# Patient Record
Sex: Male | Born: 1950 | Smoking: Former smoker
Health system: Southern US, Community
[De-identification: ages and names within clinical notes are randomized; demographics above are authoritative.]

## PROBLEM LIST (undated history)

## (undated) HISTORY — PX: KNEE ARTHROSCOPY W/ MENISCAL REPAIR: SHX1877

---

## 1993-08-17 HISTORY — PX: BILATERAL CARPAL TUNNEL RELEASE: SHX6508

## 2018-07-26 DIAGNOSIS — H52209 Unspecified astigmatism, unspecified eye: Secondary | ICD-10-CM | POA: Diagnosis not present

## 2018-07-26 DIAGNOSIS — H524 Presbyopia: Secondary | ICD-10-CM | POA: Diagnosis not present

## 2018-07-26 DIAGNOSIS — H269 Unspecified cataract: Secondary | ICD-10-CM | POA: Diagnosis not present

## 2018-07-26 DIAGNOSIS — H5203 Hypermetropia, bilateral: Secondary | ICD-10-CM | POA: Diagnosis not present

## 2018-09-07 DIAGNOSIS — M25561 Pain in right knee: Secondary | ICD-10-CM | POA: Diagnosis not present

## 2018-09-19 DIAGNOSIS — M25561 Pain in right knee: Secondary | ICD-10-CM | POA: Diagnosis not present

## 2018-09-28 DIAGNOSIS — M25561 Pain in right knee: Secondary | ICD-10-CM | POA: Diagnosis not present

## 2018-10-27 DIAGNOSIS — S83231A Complex tear of medial meniscus, current injury, right knee, initial encounter: Secondary | ICD-10-CM | POA: Diagnosis not present

## 2018-10-27 DIAGNOSIS — M94261 Chondromalacia, right knee: Secondary | ICD-10-CM | POA: Diagnosis not present

## 2018-10-27 DIAGNOSIS — M2241 Chondromalacia patellae, right knee: Secondary | ICD-10-CM | POA: Diagnosis not present

## 2018-10-27 DIAGNOSIS — G8918 Other acute postprocedural pain: Secondary | ICD-10-CM | POA: Diagnosis not present

## 2018-11-04 DIAGNOSIS — M25561 Pain in right knee: Secondary | ICD-10-CM | POA: Diagnosis not present

## 2018-11-04 DIAGNOSIS — R262 Difficulty in walking, not elsewhere classified: Secondary | ICD-10-CM | POA: Diagnosis not present

## 2018-11-04 DIAGNOSIS — M25661 Stiffness of right knee, not elsewhere classified: Secondary | ICD-10-CM | POA: Diagnosis not present

## 2018-11-04 DIAGNOSIS — Z9889 Other specified postprocedural states: Secondary | ICD-10-CM | POA: Diagnosis not present

## 2018-11-09 DIAGNOSIS — M25661 Stiffness of right knee, not elsewhere classified: Secondary | ICD-10-CM | POA: Diagnosis not present

## 2018-11-09 DIAGNOSIS — R262 Difficulty in walking, not elsewhere classified: Secondary | ICD-10-CM | POA: Diagnosis not present

## 2018-11-18 DIAGNOSIS — Z9889 Other specified postprocedural states: Secondary | ICD-10-CM | POA: Diagnosis not present

## 2018-11-30 DIAGNOSIS — Z9889 Other specified postprocedural states: Secondary | ICD-10-CM | POA: Diagnosis not present

## 2018-11-30 DIAGNOSIS — M25661 Stiffness of right knee, not elsewhere classified: Secondary | ICD-10-CM | POA: Diagnosis not present

## 2018-12-20 DIAGNOSIS — E039 Hypothyroidism, unspecified: Secondary | ICD-10-CM | POA: Diagnosis not present

## 2018-12-20 DIAGNOSIS — I10 Essential (primary) hypertension: Secondary | ICD-10-CM | POA: Diagnosis not present

## 2019-01-18 DIAGNOSIS — M109 Gout, unspecified: Secondary | ICD-10-CM | POA: Diagnosis not present

## 2019-01-18 DIAGNOSIS — I10 Essential (primary) hypertension: Secondary | ICD-10-CM | POA: Diagnosis not present

## 2019-01-24 DIAGNOSIS — M25661 Stiffness of right knee, not elsewhere classified: Secondary | ICD-10-CM | POA: Diagnosis not present

## 2019-01-24 DIAGNOSIS — R262 Difficulty in walking, not elsewhere classified: Secondary | ICD-10-CM | POA: Diagnosis not present

## 2019-02-22 DIAGNOSIS — I10 Essential (primary) hypertension: Secondary | ICD-10-CM | POA: Diagnosis not present

## 2019-05-04 DIAGNOSIS — M25561 Pain in right knee: Secondary | ICD-10-CM | POA: Diagnosis not present

## 2019-05-04 DIAGNOSIS — Z0000 Encounter for general adult medical examination without abnormal findings: Secondary | ICD-10-CM | POA: Diagnosis not present

## 2019-05-04 DIAGNOSIS — Z1211 Encounter for screening for malignant neoplasm of colon: Secondary | ICD-10-CM | POA: Diagnosis not present

## 2019-05-04 DIAGNOSIS — I10 Essential (primary) hypertension: Secondary | ICD-10-CM | POA: Diagnosis not present

## 2019-05-04 DIAGNOSIS — N529 Male erectile dysfunction, unspecified: Secondary | ICD-10-CM | POA: Diagnosis not present

## 2019-05-04 DIAGNOSIS — G8929 Other chronic pain: Secondary | ICD-10-CM | POA: Diagnosis not present

## 2019-05-04 DIAGNOSIS — E039 Hypothyroidism, unspecified: Secondary | ICD-10-CM | POA: Diagnosis not present

## 2019-05-04 DIAGNOSIS — E782 Mixed hyperlipidemia: Secondary | ICD-10-CM | POA: Diagnosis not present

## 2019-05-04 DIAGNOSIS — Z125 Encounter for screening for malignant neoplasm of prostate: Secondary | ICD-10-CM | POA: Diagnosis not present

## 2019-07-05 DIAGNOSIS — Z20828 Contact with and (suspected) exposure to other viral communicable diseases: Secondary | ICD-10-CM | POA: Diagnosis not present

## 2019-12-07 DIAGNOSIS — R030 Elevated blood-pressure reading, without diagnosis of hypertension: Secondary | ICD-10-CM | POA: Diagnosis not present

## 2019-12-07 DIAGNOSIS — R140 Abdominal distension (gaseous): Secondary | ICD-10-CM | POA: Diagnosis not present

## 2019-12-07 DIAGNOSIS — I10 Essential (primary) hypertension: Secondary | ICD-10-CM | POA: Diagnosis not present

## 2019-12-08 ENCOUNTER — Other Ambulatory Visit (HOSPITAL_BASED_OUTPATIENT_CLINIC_OR_DEPARTMENT_OTHER): Payer: Self-pay | Admitting: *Deleted

## 2019-12-08 DIAGNOSIS — R1011 Right upper quadrant pain: Secondary | ICD-10-CM

## 2019-12-08 DIAGNOSIS — I10 Essential (primary) hypertension: Secondary | ICD-10-CM

## 2019-12-15 ENCOUNTER — Other Ambulatory Visit: Payer: Self-pay

## 2019-12-15 ENCOUNTER — Ambulatory Visit (HOSPITAL_BASED_OUTPATIENT_CLINIC_OR_DEPARTMENT_OTHER)
Admission: RE | Admit: 2019-12-15 | Discharge: 2019-12-15 | Disposition: A | Discharge status: Home / Self Care | Payer: Medicare HMO | Source: Ambulatory Visit | Attending: *Deleted | Admitting: *Deleted

## 2019-12-15 DIAGNOSIS — R1011 Right upper quadrant pain: Secondary | ICD-10-CM | POA: Diagnosis not present

## 2019-12-15 DIAGNOSIS — K760 Fatty (change of) liver, not elsewhere classified: Secondary | ICD-10-CM | POA: Diagnosis not present

## 2019-12-26 DIAGNOSIS — R109 Unspecified abdominal pain: Secondary | ICD-10-CM | POA: Diagnosis not present

## 2019-12-26 DIAGNOSIS — M109 Gout, unspecified: Secondary | ICD-10-CM | POA: Diagnosis not present

## 2019-12-26 DIAGNOSIS — I10 Essential (primary) hypertension: Secondary | ICD-10-CM | POA: Diagnosis not present

## 2019-12-26 DIAGNOSIS — E039 Hypothyroidism, unspecified: Secondary | ICD-10-CM | POA: Diagnosis not present

## 2020-02-20 DIAGNOSIS — H5203 Hypermetropia, bilateral: Secondary | ICD-10-CM | POA: Diagnosis not present

## 2020-02-20 DIAGNOSIS — H524 Presbyopia: Secondary | ICD-10-CM | POA: Diagnosis not present

## 2020-02-20 DIAGNOSIS — H52209 Unspecified astigmatism, unspecified eye: Secondary | ICD-10-CM | POA: Diagnosis not present

## 2020-02-20 DIAGNOSIS — H5213 Myopia, bilateral: Secondary | ICD-10-CM | POA: Diagnosis not present

## 2020-05-16 DIAGNOSIS — Z0000 Encounter for general adult medical examination without abnormal findings: Secondary | ICD-10-CM | POA: Diagnosis not present

## 2020-05-16 DIAGNOSIS — N529 Male erectile dysfunction, unspecified: Secondary | ICD-10-CM | POA: Diagnosis not present

## 2020-05-16 DIAGNOSIS — I10 Essential (primary) hypertension: Secondary | ICD-10-CM | POA: Diagnosis not present

## 2020-05-16 DIAGNOSIS — Z23 Encounter for immunization: Secondary | ICD-10-CM | POA: Diagnosis not present

## 2020-05-16 DIAGNOSIS — E039 Hypothyroidism, unspecified: Secondary | ICD-10-CM | POA: Diagnosis not present

## 2020-05-16 DIAGNOSIS — Z125 Encounter for screening for malignant neoplasm of prostate: Secondary | ICD-10-CM | POA: Diagnosis not present

## 2020-05-16 DIAGNOSIS — E782 Mixed hyperlipidemia: Secondary | ICD-10-CM | POA: Diagnosis not present

## 2020-05-24 DIAGNOSIS — I10 Essential (primary) hypertension: Secondary | ICD-10-CM | POA: Diagnosis not present

## 2020-05-24 DIAGNOSIS — Z0000 Encounter for general adult medical examination without abnormal findings: Secondary | ICD-10-CM | POA: Diagnosis not present

## 2020-05-24 DIAGNOSIS — E782 Mixed hyperlipidemia: Secondary | ICD-10-CM | POA: Diagnosis not present

## 2020-05-24 DIAGNOSIS — E039 Hypothyroidism, unspecified: Secondary | ICD-10-CM | POA: Diagnosis not present

## 2020-05-24 DIAGNOSIS — Z1211 Encounter for screening for malignant neoplasm of colon: Secondary | ICD-10-CM | POA: Diagnosis not present

## 2020-05-24 DIAGNOSIS — N529 Male erectile dysfunction, unspecified: Secondary | ICD-10-CM | POA: Diagnosis not present

## 2020-05-24 DIAGNOSIS — Z125 Encounter for screening for malignant neoplasm of prostate: Secondary | ICD-10-CM | POA: Diagnosis not present

## 2020-09-09 DIAGNOSIS — R509 Fever, unspecified: Secondary | ICD-10-CM | POA: Diagnosis not present

## 2020-09-09 DIAGNOSIS — J069 Acute upper respiratory infection, unspecified: Secondary | ICD-10-CM | POA: Diagnosis not present

## 2020-09-09 DIAGNOSIS — Z20822 Contact with and (suspected) exposure to covid-19: Secondary | ICD-10-CM | POA: Diagnosis not present

## 2020-10-15 HISTORY — PX: OTHER SURGICAL HISTORY: SHX169

## 2020-11-19 DIAGNOSIS — N401 Enlarged prostate with lower urinary tract symptoms: Secondary | ICD-10-CM | POA: Diagnosis not present

## 2020-11-19 DIAGNOSIS — N202 Calculus of kidney with calculus of ureter: Secondary | ICD-10-CM | POA: Diagnosis not present

## 2020-11-19 DIAGNOSIS — N138 Other obstructive and reflux uropathy: Secondary | ICD-10-CM | POA: Diagnosis not present

## 2020-11-21 DIAGNOSIS — N1330 Unspecified hydronephrosis: Secondary | ICD-10-CM | POA: Diagnosis not present

## 2020-11-21 DIAGNOSIS — N201 Calculus of ureter: Secondary | ICD-10-CM | POA: Diagnosis not present

## 2020-11-21 DIAGNOSIS — N200 Calculus of kidney: Secondary | ICD-10-CM | POA: Diagnosis not present

## 2020-11-21 DIAGNOSIS — M47816 Spondylosis without myelopathy or radiculopathy, lumbar region: Secondary | ICD-10-CM | POA: Diagnosis not present

## 2020-11-25 DIAGNOSIS — N200 Calculus of kidney: Secondary | ICD-10-CM | POA: Diagnosis not present

## 2020-11-25 DIAGNOSIS — N201 Calculus of ureter: Secondary | ICD-10-CM | POA: Diagnosis not present

## 2020-11-26 DIAGNOSIS — N132 Hydronephrosis with renal and ureteral calculous obstruction: Secondary | ICD-10-CM | POA: Diagnosis not present

## 2020-11-26 DIAGNOSIS — N210 Calculus in bladder: Secondary | ICD-10-CM | POA: Diagnosis not present

## 2020-11-26 DIAGNOSIS — R1011 Right upper quadrant pain: Secondary | ICD-10-CM | POA: Diagnosis not present

## 2020-11-26 DIAGNOSIS — Z01810 Encounter for preprocedural cardiovascular examination: Secondary | ICD-10-CM | POA: Diagnosis not present

## 2020-11-29 DIAGNOSIS — N201 Calculus of ureter: Secondary | ICD-10-CM | POA: Diagnosis not present

## 2020-11-29 DIAGNOSIS — R339 Retention of urine, unspecified: Secondary | ICD-10-CM | POA: Insufficient documentation

## 2020-11-29 DIAGNOSIS — N390 Urinary tract infection, site not specified: Secondary | ICD-10-CM

## 2020-11-29 DIAGNOSIS — R8290 Unspecified abnormal findings in urine: Secondary | ICD-10-CM | POA: Diagnosis not present

## 2020-11-29 HISTORY — DX: Urinary tract infection, site not specified: N39.0

## 2020-11-29 HISTORY — DX: Retention of urine, unspecified: R33.9

## 2020-12-03 DIAGNOSIS — R339 Retention of urine, unspecified: Secondary | ICD-10-CM | POA: Diagnosis not present

## 2020-12-03 DIAGNOSIS — N201 Calculus of ureter: Secondary | ICD-10-CM | POA: Diagnosis not present

## 2020-12-13 IMAGING — US US ABDOMEN LIMITED
1 series · 13 of 25 positions shown · non-contrast
Comparison: None.

CLINICAL DATA: 68-year-old male with history of intermittent right
upper quadrant abdominal pain for the past 4 months.

EXAM:
ULTRASOUND ABDOMEN LIMITED RIGHT UPPER QUADRANT

[Series 1: us abdomen limited · 13 of 79 slices shown]
[im 1/79]
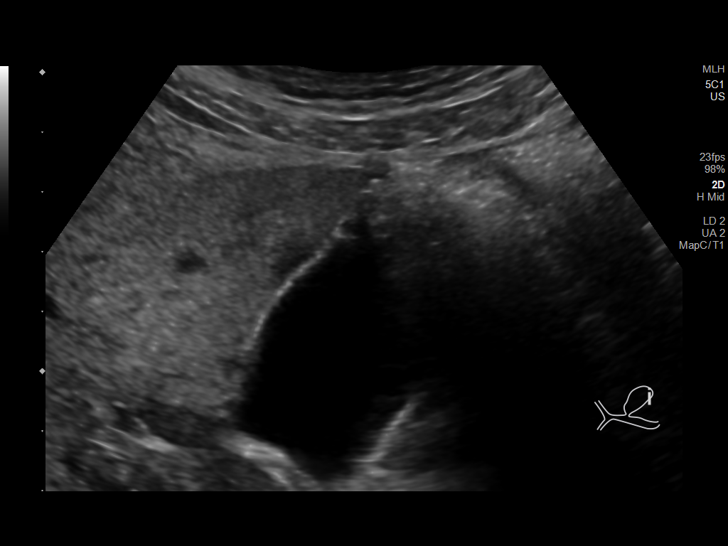
[im 7/79]
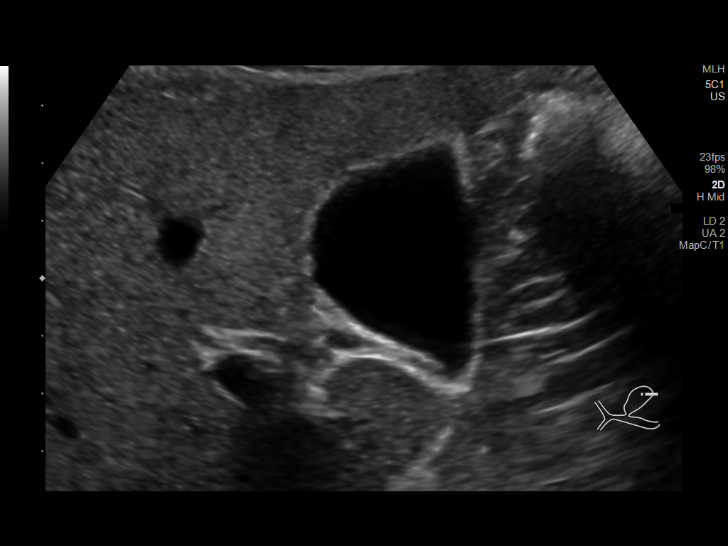
[im 14/79]
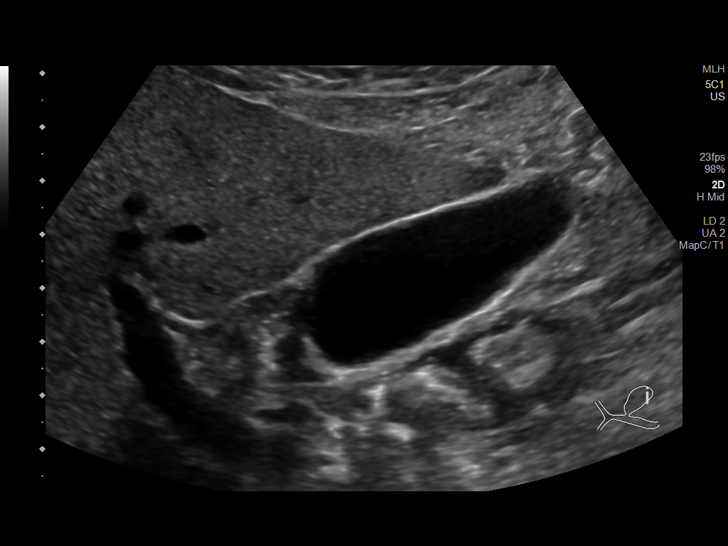
[im 20/79]
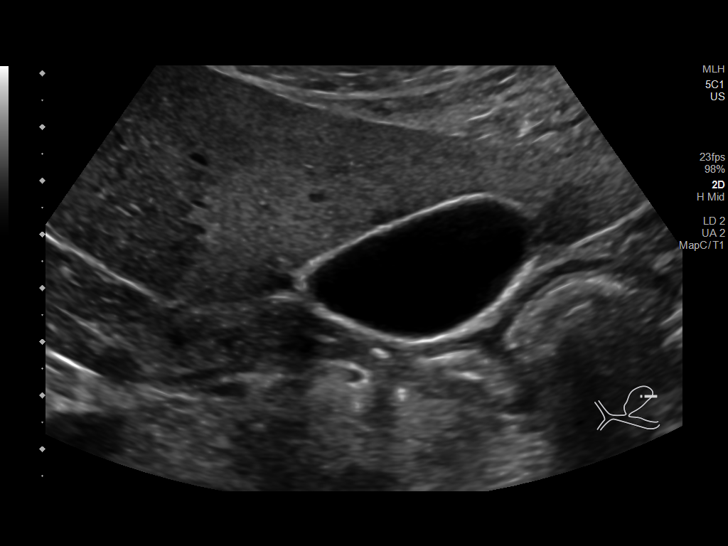
[im 27/79]
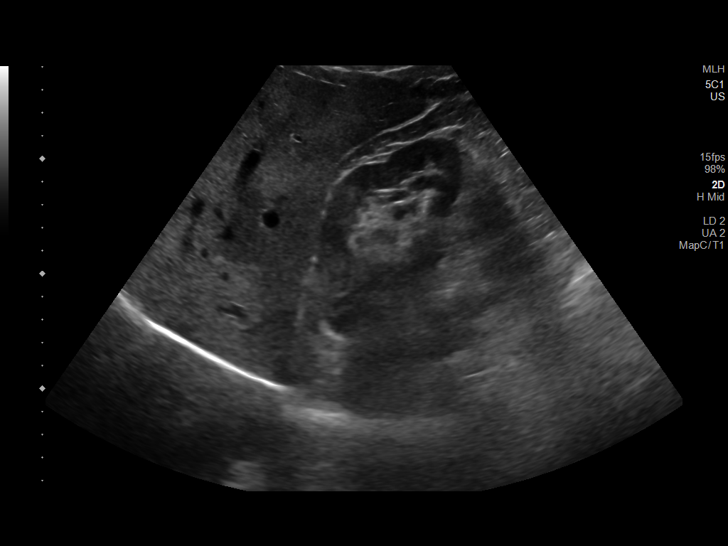
[im 33/79]
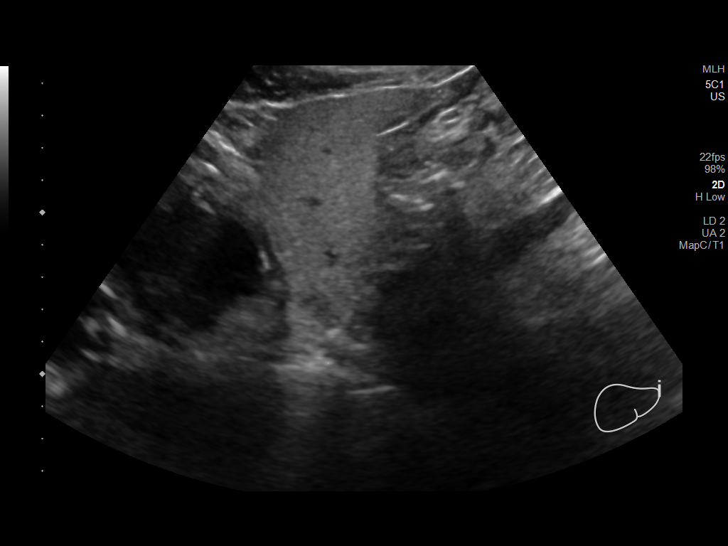
[im 40/79]
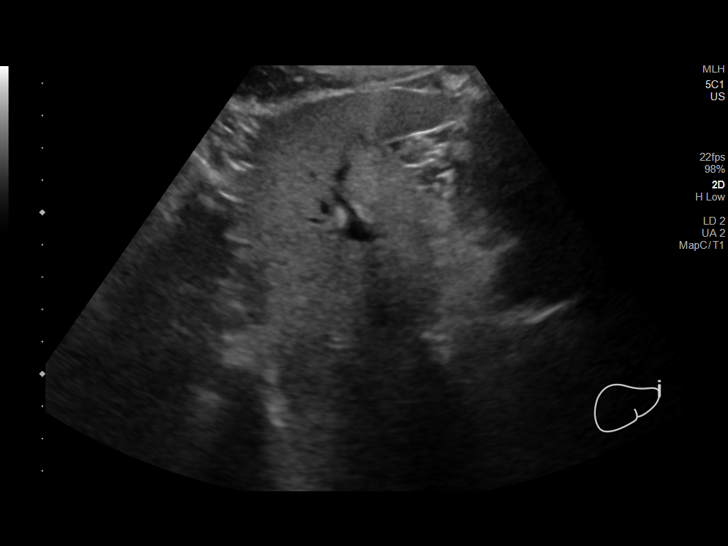
[im 46/79]
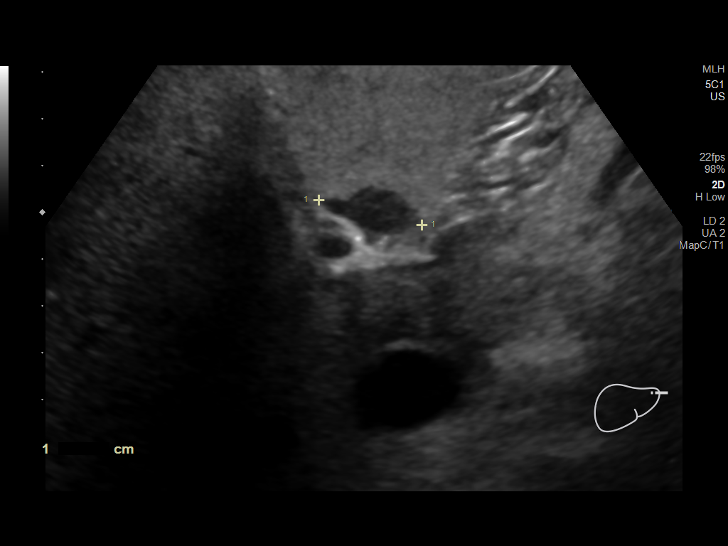
[im 53/79]
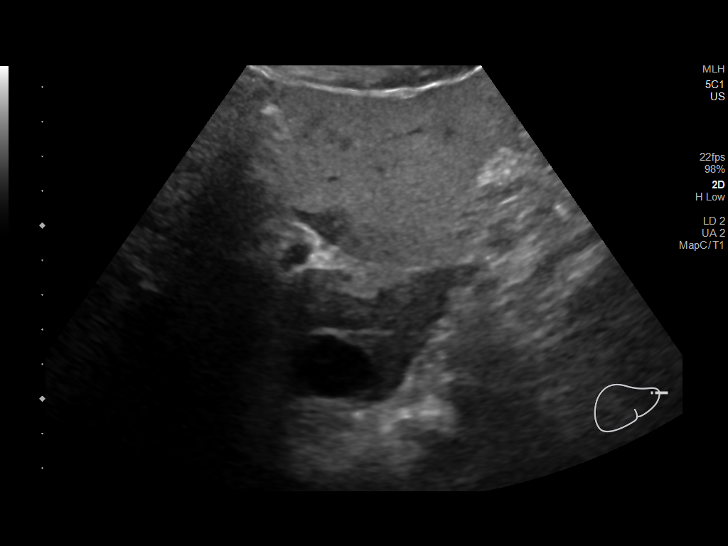
[im 59/79]
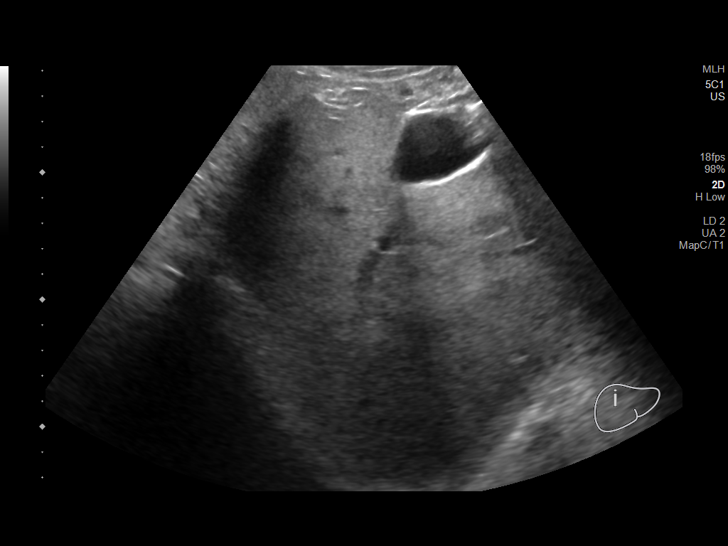
[im 66/79]
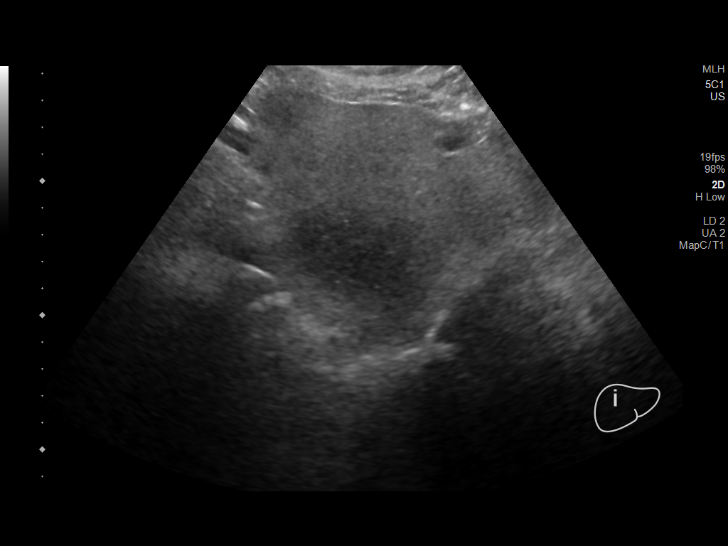
[im 72/79]
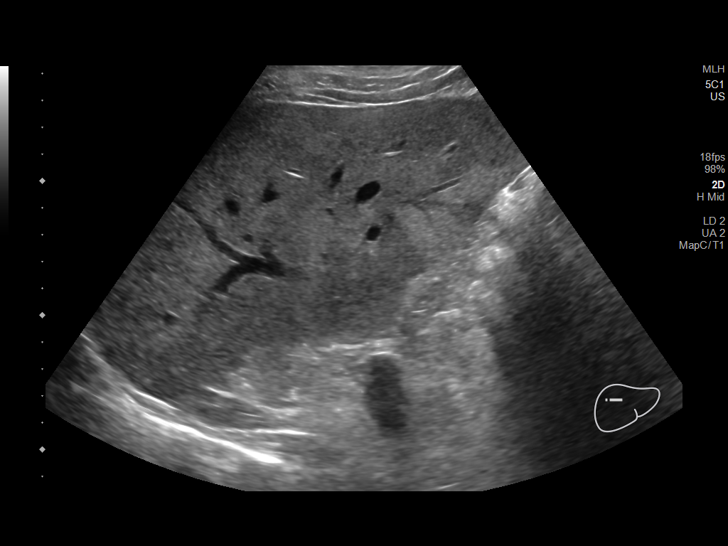
[im 79/79]
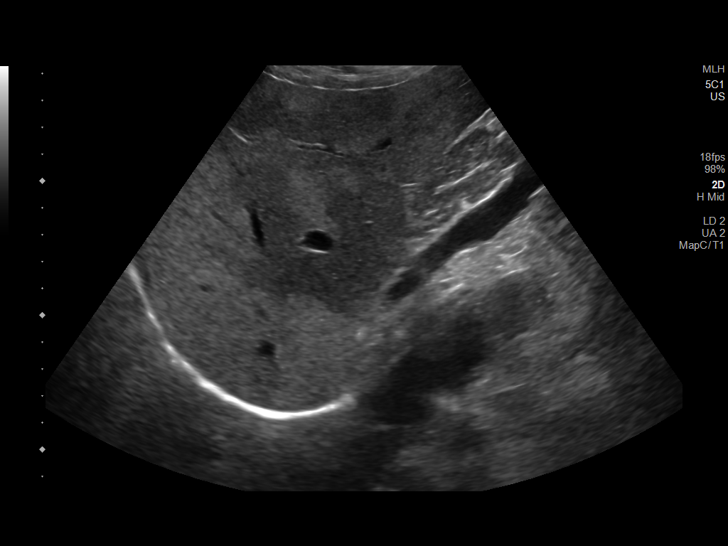

[13 of 25 positions shown; findings below may reference images not displayed]

FINDINGS: Gallbladder:

No gallstones or wall thickening visualized. No sonographic Murphy
sign noted by sonographer.

Common bile duct:

Diameter: 2.9 mm

Liver:

In the posterior aspect of the left lobe of the liver there is a
x 1.1 cm hypoechoic lesion with some increased through transmission,
favored to represent a small cyst. Mild diffuse increased
echogenicity throughout the hepatic parenchyma. Portal vein is
patent on color Doppler imaging with normal direction of blood flow
towards the liver.

Other: None.
IMPRESSION: 1. No acute findings. Specifically, no gallstones or findings to
suggest an acute cholecystitis at this time.
2. Hepatic steatosis.
3. 2.1 x 1.1 cm hypoechoic lesion with some increased through
transmission in the posterior aspect of the left lobe of the liver,
incompletely characterize, but likely to represent a mildly
proteinaceous cyst. This could be definitively characterized with
nonemergent MRI of the abdomen with and without IV gadolinium if of
clinical concern.

## 2020-12-17 DIAGNOSIS — R338 Other retention of urine: Secondary | ICD-10-CM | POA: Diagnosis not present

## 2020-12-17 DIAGNOSIS — G8929 Other chronic pain: Secondary | ICD-10-CM

## 2020-12-17 DIAGNOSIS — N529 Male erectile dysfunction, unspecified: Secondary | ICD-10-CM

## 2020-12-17 DIAGNOSIS — R030 Elevated blood-pressure reading, without diagnosis of hypertension: Secondary | ICD-10-CM | POA: Insufficient documentation

## 2020-12-17 DIAGNOSIS — I10 Essential (primary) hypertension: Secondary | ICD-10-CM | POA: Insufficient documentation

## 2020-12-17 DIAGNOSIS — N401 Enlarged prostate with lower urinary tract symptoms: Secondary | ICD-10-CM | POA: Diagnosis not present

## 2020-12-17 DIAGNOSIS — N200 Calculus of kidney: Secondary | ICD-10-CM | POA: Diagnosis not present

## 2020-12-17 DIAGNOSIS — R339 Retention of urine, unspecified: Secondary | ICD-10-CM | POA: Diagnosis not present

## 2020-12-17 DIAGNOSIS — E782 Mixed hyperlipidemia: Secondary | ICD-10-CM | POA: Insufficient documentation

## 2020-12-17 DIAGNOSIS — N138 Other obstructive and reflux uropathy: Secondary | ICD-10-CM | POA: Diagnosis not present

## 2020-12-17 DIAGNOSIS — E039 Hypothyroidism, unspecified: Secondary | ICD-10-CM

## 2020-12-17 DIAGNOSIS — M109 Gout, unspecified: Secondary | ICD-10-CM

## 2020-12-17 DIAGNOSIS — H9190 Unspecified hearing loss, unspecified ear: Secondary | ICD-10-CM

## 2020-12-17 HISTORY — DX: Gout, unspecified: M10.9

## 2020-12-17 HISTORY — DX: Male erectile dysfunction, unspecified: N52.9

## 2020-12-17 HISTORY — DX: Mixed hyperlipidemia: E78.2

## 2020-12-17 HISTORY — DX: Hypothyroidism, unspecified: E03.9

## 2020-12-17 HISTORY — DX: Other chronic pain: G89.29

## 2020-12-17 HISTORY — DX: Elevated blood-pressure reading, without diagnosis of hypertension: R03.0

## 2020-12-17 HISTORY — DX: Unspecified hearing loss, unspecified ear: H91.90

## 2020-12-24 DIAGNOSIS — R079 Chest pain, unspecified: Secondary | ICD-10-CM | POA: Diagnosis not present

## 2020-12-24 DIAGNOSIS — Z8616 Personal history of COVID-19: Secondary | ICD-10-CM | POA: Diagnosis not present

## 2020-12-24 DIAGNOSIS — E039 Hypothyroidism, unspecified: Secondary | ICD-10-CM | POA: Diagnosis not present

## 2020-12-24 DIAGNOSIS — I10 Essential (primary) hypertension: Secondary | ICD-10-CM | POA: Diagnosis not present

## 2021-02-04 ENCOUNTER — Other Ambulatory Visit: Payer: Self-pay

## 2021-02-06 ENCOUNTER — Ambulatory Visit: Payer: Medicare HMO | Admitting: Cardiology

## 2021-02-06 ENCOUNTER — Encounter: Payer: Self-pay | Admitting: Cardiology

## 2021-02-06 ENCOUNTER — Other Ambulatory Visit: Payer: Self-pay

## 2021-02-06 VITALS — BP 146/80 | HR 73 | Ht 69.0 in | Wt 187.0 lb

## 2021-02-06 DIAGNOSIS — I209 Angina pectoris, unspecified: Secondary | ICD-10-CM | POA: Diagnosis not present

## 2021-02-06 DIAGNOSIS — E782 Mixed hyperlipidemia: Secondary | ICD-10-CM | POA: Diagnosis not present

## 2021-02-06 DIAGNOSIS — R079 Chest pain, unspecified: Secondary | ICD-10-CM

## 2021-02-06 DIAGNOSIS — I10 Essential (primary) hypertension: Secondary | ICD-10-CM | POA: Diagnosis not present

## 2021-02-06 MED ORDER — METOPROLOL TARTRATE 50 MG PO TABS: 50.0000 mg | ORAL_TABLET | Freq: Once | ORAL | 0 refills | Status: DC

## 2021-02-06 MED ORDER — ROSUVASTATIN CALCIUM 20 MG PO TABS: 20.0000 mg | ORAL_TABLET | Freq: Every day | ORAL | 1 refills | Status: DC

## 2021-02-06 MED ORDER — ASPIRIN EC 81 MG PO TBEC: 81.0000 mg | DELAYED_RELEASE_TABLET | Freq: Every day | ORAL | 3 refills | Status: AC

## 2021-02-06 NOTE — Progress Notes (Signed)
Cardiology Consultation:    Date:  02/06/2021   ID:  Alvey Brockel, DOB [birth year], MRN [id number]  PCP:  Soundra Pilon, FNP  Cardiologist:  Gypsy Balsam, MD   Referring MD: Soundra Pilon, FNP   Chief Complaint  Patient presents with   Chest Pain    X 2years w/ certain activities     History of Present Illness:    Kristopher Sanford is a 70 y.o. male who is being seen today for the evaluation of chest pain at the request of Soundra Pilon, FNP.  Past medical history significant for essential hypertension which appears to be somewhat difficult to control, dyslipidemia without any medications, he is afraid to take statins, remote history of smoking.  He comes today to my office of to be establish as a patient for about 2 years he has been experiencing exertional burning in the chest.  This is relatively stable pattern.  He said anytime he will do well to be more heavy exercise or go uphill he will develop this sensation he slows down the sensation goes away also sometimes when he keep going cessation will just simply go away by itself.  Duration of this sensation is usually few minutes.  There is no swelling there is no radiation there is no additional symptoms associated with this sensation.  He thinks maybe it is slightly worse now.  He never had resting pain.  He did not try anything to help with the pain.  Taking the breath or coughing does not relieve the pain.  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   aspirin EC 81 MG tablet Take 1 tablet (81 mg total) by mouth daily. Swallow whole.   Cholecalciferol (VITAMIN D3) 250 MCG (10000 UT) capsule Take 1 capsule by mouth daily.   Coenzyme Q10 (COQ10 MAXIMUM STRENGTH) 400 MG CAPS Take 1 capsule by mouth daily.   labetalol (NORMODYNE) 100 MG tablet Take 1 tablet by mouth 2 (two) times daily.   levothyroxine (SYNTHROID) 200 MCG tablet Take 1 tablet by mouth daily.   lisinopril (ZESTRIL) 40 MG tablet Take 1 tablet by mouth daily.   metoprolol tartrate (LOPRESSOR) 50 MG tablet Take 1 tablet (50 mg total) by mouth once for 1 dose. 2 hours before ct   rosuvastatin (CRESTOR) 20 MG tablet Take 1 tablet (20 mg total) by mouth daily.   selenium 200 MCG TABS tablet Take 200 tablets by mouth daily.   vitamin C (ASCORBIC ACID) 500 MG tablet Take 1 tablet by mouth daily.   Zinc 50  MG TABS Take 1 tablet by mouth daily.     Allergies:   Patient has no known allergies.   Social History   Socioeconomic History   Marital status: Unknown    Spouse name: Not on file   Number of children: Not on file   Years of education: Not on file   Highest education level: Not on file  Occupational History   Not on file  Tobacco Use   Smoking status: Former    Pack years: 0.00    Types: Cigarettes    Start date: 08/17/1974   Smokeless tobacco: Never  Vaping Use   Vaping Use: Never used  Substance and Sexual Activity   Alcohol use: Yes    Comment: Socially   Drug use: Never   Sexual activity: Yes  Other Topics Concern   Not on file  Social History  Narrative   Not on file   Social Determinants of Health   Financial Resource Strain: Not on file  Food Insecurity: Not on file  Transportation Needs: Not on file  Physical Activity: Not on file  Stress: Not on file  Social Connections: Not on file     Family History: The patient's family history includes Hypertension in his brother, brother, father, and mother. ROS:   Please see the history of present illness.    All 14 point review of systems negative except as described per history of present illness.  EKGs/Labs/Other Studies Reviewed:    The following studies were reviewed today:   EKG:  EKG is  ordered today.  The ekg ordered today demonstrates normal sinus rhythm, normal PR interval, nonspecific T wave abnormalities.  Recent Labs: No results found for requested labs within last 8760 hours.  Recent Lipid Panel No results found for: CHOL, TRIG, HDL, CHOLHDL, VLDL, LDLCALC, LDLDIRECT  Physical Exam:    VS:  BP (!) 146/80 (BP Location: Right Arm, Patient Position: Sitting)   Pulse 73   Ht 5\' 9"  (1.753 m)   Wt 187 lb (84.8 kg)   SpO2 96%   BMI 27.62 kg/m     Wt Readings from Last 3 Encounters:  02/06/21 187 lb (84.8 kg)     GEN:  Well nourished, well developed in no acute distress HEENT: Normal NECK: No JVD; No carotid bruits LYMPHATICS: No lymphadenopathy CARDIAC: RRR, no murmurs, no rubs, no gallops RESPIRATORY:  Clear to auscultation without rales, wheezing or rhonchi  ABDOMEN: Soft, non-tender, non-distended MUSCULOSKELETAL:  No edema; No deformity  SKIN: Warm and dry NEUROLOGIC:  Alert and oriented x 3 PSYCHIATRIC:  Normal affect   ASSESSMENT:    1. Chest pain of uncertain etiology   2. Angina pectoris (HCC)   3. Primary hypertension   4. Mixed hyperlipidemia    PLAN:    In order of problems listed above:  Angina pectoris.  He presented with fairly typical exertional angina pectoris.  He is 02/08/21 classification is 2.  It happened only with  extreme exertion with relatively stable pattern.  We did talk about options for the situation option being medical therapy versus stress testing versus coronary CT angio versus even cardiac catheterization.  He does not want to have a cardiac catheterization yet however he prefers coronary CT angio.  I explained procedure to him and we will make arrangements for it to happen.  In the meantime I asked him to start taking 1 baby aspirin every single day.  He is already on beta-blocker which I will continue.  He will  be given nitroglycerin prescription.  I will also start him on statin.  I told him to go to the emergency room if pain is not relieved by nitroglycerin. Essential hypertension.  Blood pressure elevated but this is first visit to my office.  Recently he was put on calcium channel blocker in form of amlodipine.  We will give it a few more days to work.  I will schedule him to have an echocardiogram to assess left ventricular hypertrophy. Dyslipidemia, his questions absolutely unacceptable.  His LDL is 158 HDL 38 this is from November of last year.  I did explain to him was the rationale for taking cholesterol medication and he is willing to try.  I will put him on Crestor 20 mg daily. We did talk in length about healthy lifestyle and I also told him that exercise is beneficial to him but asked him not to push himself until we find out what is the problem.  In the future we will talk also about diet.   Medication Adjustments/Labs and Tests Ordered: Current medicines are reviewed at length with the patient today.  Concerns regarding medicines are outlined above.  Orders Placed This Encounter  Procedures   CT CORONARY MORPH W/CTA COR W/SCORE W/CA W/CM &/OR WO/CM   Basic metabolic panel   EKG 12-Lead   ECHOCARDIOGRAM COMPLETE   Meds ordered this encounter  Medications   metoprolol tartrate (LOPRESSOR) 50 MG tablet    Sig: Take 1 tablet (50 mg total) by mouth once for 1 dose. 2 hours before ct     Dispense:  1 tablet    Refill:  0   rosuvastatin (CRESTOR) 20 MG tablet    Sig: Take 1 tablet (20 mg total) by mouth daily.    Dispense:  90 tablet    Refill:  1   aspirin EC 81 MG tablet    Sig: Take 1 tablet (81 mg total) by mouth daily. Swallow whole.    Dispense:  90 tablet    Refill:  3    Signed, Georgeanna Lea, MD, [facility]. 02/06/2021 12:14 PM     [facility]

## 2021-02-06 NOTE — Patient Instructions (Signed)
Medication Instructions:  Your physician has recommended you make the following change in your medication:  START: Aspirin 81 mg daily   START: Crestor 20 mg daily   *If you need a refill on your cardiac medications before your next appointment, please call your pharmacy*   Lab Work: Your physician recommends that you return for lab work 3-7 days before ct : BMP   If you have labs (blood work) drawn today and your tests are completely normal, you will receive your results only by: MyChart Message (if you have MyChart) OR A paper copy in the mail If you have any lab test that is abnormal or we need to change your treatment, we will call you to review the results.   Testing/Procedures: Your physician has requested that you have an echocardiogram. Echocardiography is a painless test that uses sound waves to create images of your heart. It provides your doctor with information about the size and shape of your heart and how well your heart's chambers and valves are working. This procedure takes approximately one hour. There are no restrictions for this procedure.  Your cardiac CT will be scheduled at one of the below locations:   [facility] [address] [place], [place] [address] [phone]  OR  [facility] [address] [place], [place] [address] [phone]  If scheduled at [facility], please arrive at the [facility] main entrance (entrance A) of [facility] 30 minutes prior to test start time. Proceed to the [facility] Radiology Department (first floor) to check-in and test prep.  If scheduled at [facility], please arrive 15 mins early for check-in and test prep.  Please follow these instructions carefully (unless otherwise directed):  Hold all erectile dysfunction medications at least 3 days (72 hrs) prior to test.  On the Night Before the Test: Be sure to Drink  plenty of water. Do not consume any caffeinated/decaffeinated beverages or chocolate 12 hours prior to your test. Do not take any antihistamines 12 hours prior to your test.   On the Day of the Test: Drink plenty of water until 1 hour prior to the test. Do not eat any food 4 hours prior to the test. You may take your regular medications prior to the test.  Take metoprolol (Lopressor) two hours prior to test.          After the Test: Drink plenty of water. After receiving IV contrast, you may experience a mild flushed feeling. This is normal. On occasion, you may experience a mild rash up to 24 hours after the test. This is not dangerous. If this occurs, you can take Benadryl 25 mg and increase your fluid intake. If you experience trouble breathing, this can be serious. If it is severe call 911 IMMEDIATELY. If it is mild, please call our office. If you take any of these medications: Glipizide/Metformin, Avandament, Glucavance, please do not take 48 hours after completing test unless otherwise instructed.   Once we have confirmed authorization from your insurance company, we will call you to set up a date and time for your test. Based on how quickly your insurance processes prior authorizations requests, please allow up to 4 weeks to be contacted for scheduling your Cardiac CT appointment. Be advised that routine Cardiac CT appointments could be scheduled as many as 8 weeks after your provider has ordered it.  For non-scheduling related questions, please contact the cardiac imaging nurse navigator  should you have any questions/concerns: Rockwell Alexandria, Cardiac Imaging Nurse Navigator Larey Brick, Cardiac Imaging Nurse Navigator [facility] Direct Office Dial: [phone]   For scheduling needs, including cancellations and rescheduling, please call [place], [phone].    Follow-Up: At [facility], you and your health needs are our priority.  As part  of our continuing mission to provide you with exceptional heart care, we have created designated Provider Care Teams.  These Care Teams include your primary Cardiologist (physician) and Advanced Practice Providers (APPs -  Physician Assistants and Nurse Practitioners) who all work together to provide you with the care you need, when you need it.  We recommend signing up for the patient portal called "MyChart".  Sign up information is provided on this After Visit Summary.  MyChart is used to connect with patients for Virtual Visits (Telemedicine).  Patients are able to view lab/test results, encounter notes, upcoming appointments, etc.  Non-urgent messages can be sent to your provider as well.   To learn more about what you can do with MyChart, go to [email or web address].    Your next appointment:   2 month(s)  The format for your next appointment:   In Person  Provider:   Gypsy Balsam, MD   Other Instructions  Echocardiogram An echocardiogram is a test that uses sound waves (ultrasound) to produce images of the heart. Images from an echocardiogram can provide important information about: Heart size and shape. The size and thickness and movement of your heart's walls. Heart muscle function and strength. Heart valve function or if you have stenosis. Stenosis is when the heart valves are too narrow. If blood is flowing backward through the heart valves (regurgitation). A tumor or infectious growth around the heart valves. Areas of heart muscle that are not working well because of poor blood flow or injury from a heart attack. Aneurysm detection. An aneurysm is a weak or damaged part of an artery wall. The wall bulges out from the normal force of blood pumping through the body. Tell a health care provider about: Any allergies you have. All medicines you are taking, including vitamins, herbs, eye drops, creams, and over-the-counter medicines. Any blood disorders you have. Any  surgeries you have had. Any medical conditions you have. Whether you are pregnant or may be pregnant. What are the risks? Generally, this is a safe test. However, problems may occur, including an allergic reaction to dye (contrast) that may be used during the test. What happens before the test? No specific preparation is needed. You may eat and drink normally. What happens during the test?  You will take off your clothes from the waist up and put on a hospital gown. Electrodes or electrocardiogram (ECG)patches may be placed on your chest. The electrodes or patches are then connected to a device that monitors your heart rate and rhythm. You will lie down on a table for an ultrasound exam. A gel will be applied to your chest to help sound waves pass through your skin. A handheld device, called a transducer, will be pressed against your chest and moved over your heart. The transducer produces sound waves that travel to your heart and bounce back (or "echo" back) to the transducer. These sound waves will be captured in real-time and changed into images of your heart that can be viewed on a video monitor. The images will be recorded on a computer and reviewed by your health care provider. You may be asked to change positions or hold  your breath for a short time. This makes it easier to get different views or better views of your heart. In some cases, you may receive contrast through an IV in one of your veins. This can improve the quality of the pictures from your heart. The procedure may vary among health care providers and hospitals. What can I expect after the test? You may return to your normal, everyday life, including diet, activities, andmedicines, unless your health care provider tells you not to do that. Follow these instructions at home: It is up to you to get the results of your test. Ask your health care provider, or the department that is doing the test, when your results will be  ready. Keep all follow-up visits. This is important. Summary An echocardiogram is a test that uses sound waves (ultrasound) to produce images of the heart. Images from an echocardiogram can provide important information about the size and shape of your heart, heart muscle function, heart valve function, and other possible heart problems. You do not need to do anything to prepare before this test. You may eat and drink normally. After the echocardiogram is completed, you may return to your normal, everyday life, unless your health care provider tells you not to do that. This information is not intended to replace advice given to you by your health care provider. Make sure you discuss any questions you have with your healthcare provider. Document Revised: 03/26/2020 Document Reviewed: 03/26/2020 Elsevier Patient Education  2022 Elsevier Inc.  Cardiac CT Angiogram A cardiac CT angiogram is a procedure to look at the heart and the area around the heart. It may be done to help find the cause of chest pains or other symptoms of heart disease. During this procedure, a substance called contrast dye is injected into the blood vessels in the area to be checked. A large X-ray machine, called a CT scanner, then takes detailed pictures of the heart and the surrounding area. The procedure is also sometimes called a coronary CTangiogram, coronary artery scanning, or CTA. A cardiac CT angiogram allows the health care provider to see how well blood is flowing to and from the heart. The health care provider will be able to see if there are any problems, such as: Blockage or narrowing of the coronary arteries in the heart. Fluid around the heart. Signs of weakness or disease in the muscles, valves, and tissues of the heart. Tell a health care provider about: Any allergies you have. This is especially important if you have had a previous allergic reaction to contrast dye. All medicines you are taking, including  vitamins, herbs, eye drops, creams, and over-the-counter medicines. Any blood disorders you have. Any surgeries you have had. Any medical conditions you have. Whether you are pregnant or may be pregnant. Any anxiety disorders, chronic pain, or other conditions you have that may increase your stress or prevent you from lying still. What are the risks? Generally, this is a safe procedure. However, problems may occur, including: Bleeding. Infection. Allergic reactions to medicines or dyes. Damage to other structures or organs. Kidney damage from the contrast dye that is used. Increased risk of cancer from radiation exposure. This risk is low. Talk with your health care provider about: The risks and benefits of testing. How you can receive the lowest dose of radiation. What happens before the procedure? Wear comfortable clothing and remove any jewelry, glasses, dentures, and hearing aids. Follow instructions from your health care provider about eating and drinking. This may  include: For 12 hours before the procedure -- avoid caffeine. This includes tea, coffee, soda, energy drinks, and diet pills. Drink plenty of water or other fluids that do not have caffeine in them. Being well hydrated can prevent complications. For 4-6 hours before the procedure -- stop eating and drinking. The contrast dye can cause nausea, but this is less likely if your stomach is empty. Ask your health care provider about changing or stopping your regular medicines. This is especially important if you are taking diabetes medicines, blood thinners, or medicines to treat problems with erections (erectile dysfunction). What happens during the procedure?  Hair on your chest may need to be removed so that small sticky patches called electrodes can be placed on your chest. These will transmit information that helps to monitor your heart during the procedure. An IV will be inserted into one of your veins. You might be given a  medicine to control your heart rate during the procedure. This will help to ensure that good images are obtained. You will be asked to lie on an exam table. This table will slide in and out of the CT machine during the procedure. Contrast dye will be injected into the IV. You might feel warm, or you may get a metallic taste in your mouth. You will be given a medicine called nitroglycerin. This will relax or dilate the arteries in your heart. The table that you are lying on will move into the CT machine tunnel for the scan. The person running the machine will give you instructions while the scans are being done. You may be asked to: Keep your arms above your head. Hold your breath. Stay very still, even if the table is moving. When the scanning is complete, you will be moved out of the machine. The IV will be removed. The procedure may vary among health care providers and hospitals. What can I expect after the procedure? After your procedure, it is common to have: A metallic taste in your mouth from the contrast dye. A feeling of warmth. A headache from the nitroglycerin. Follow these instructions at home: Take over-the-counter and prescription medicines only as told by your health care provider. If you are told, drink enough fluid to keep your urine pale yellow. This will help to flush the contrast dye out of your body. Most people can return to their normal activities right after the procedure. Ask your health care provider what activities are safe for you. It is up to you to get the results of your procedure. Ask your health care provider, or the department that is doing the procedure, when your results will be ready. Keep all follow-up visits as told by your health care provider. This is important. Contact a health care provider if: You have any symptoms of allergy to the contrast dye. These include: Shortness of breath. Rash or hives. A racing heartbeat. Summary A cardiac CT  angiogram is a procedure to look at the heart and the area around the heart. It may be done to help find the cause of chest pains or other symptoms of heart disease. During this procedure, a large X-ray machine, called a CT scanner, takes detailed pictures of the heart and the surrounding area after a contrast dye has been injected into blood vessels in the area. Ask your health care provider about changing or stopping your regular medicines before the procedure. This is especially important if you are taking diabetes medicines, blood thinners, or medicines to treat erectile dysfunction.  If you are told, drink enough fluid to keep your urine pale yellow. This will help to flush the contrast dye out of your body. This information is not intended to replace advice given to you by your health care provider. Make sure you discuss any questions you have with your healthcare provider. Document Revised: 03/29/2019 Document Reviewed: 03/29/2019 Elsevier Patient Education  2022 [employer].

## 2021-02-19 ENCOUNTER — Other Ambulatory Visit: Payer: Self-pay

## 2021-02-19 ENCOUNTER — Telehealth: Payer: Self-pay

## 2021-02-19 ENCOUNTER — Ambulatory Visit (HOSPITAL_BASED_OUTPATIENT_CLINIC_OR_DEPARTMENT_OTHER)
Admission: RE | Admit: 2021-02-19 | Discharge: 2021-02-19 | Disposition: A | Discharge status: Home / Self Care | Payer: Medicare HMO | Source: Ambulatory Visit | Attending: Cardiology | Admitting: Cardiology

## 2021-02-19 DIAGNOSIS — R079 Chest pain, unspecified: Secondary | ICD-10-CM

## 2021-02-19 LAB — ECHOCARDIOGRAM COMPLETE
AV Mean grad: 7 mmHg
AV Peak grad: 11.8 mmHg
Ao pk vel: 1.72 m/s
Area-P 1/2: 4.17 cm2
Calc EF: 75.1 %
S' Lateral: 2.44 cm
Single Plane A2C EF: 73.5 %
Single Plane A4C EF: 77.8 %

## 2021-02-19 NOTE — Telephone Encounter (Signed)
Spoke with patient regarding results and recommendation.  Patient verbalizes understanding and is agreeable to plan of care. Advised patient to call back with any issues or concerns.  

## 2021-02-19 NOTE — Progress Notes (Signed)
*  PRELIMINARY RESULTS* Echocardiogram 2D Echocardiogram has been performed.  Neomia Dear RDCS 02/19/2021, 9:41 AM

## 2021-02-19 NOTE — Telephone Encounter (Signed)
Left message on patients voicemail to please return our call.   

## 2021-02-19 NOTE — Telephone Encounter (Signed)
-----   Message from Georgeanna Lea, MD sent at 02/19/2021 12:42 PM EDT ----- Echo cardiogram showed preserved left ventricle ejection fraction, mild left ventricle hypertrophy, mild dilatation of the aorta, none of this is dramatic.  Continue present management

## 2021-02-19 NOTE — Telephone Encounter (Signed)
Pt is returning a call about results  

## 2021-02-20 ENCOUNTER — Ambulatory Visit (HOSPITAL_COMMUNITY): Payer: Medicare HMO

## 2021-02-25 ENCOUNTER — Telehealth (HOSPITAL_COMMUNITY): Payer: Self-pay | Admitting: Emergency Medicine

## 2021-02-25 NOTE — Telephone Encounter (Signed)
Attempted to call patient regarding upcoming cardiac CT appointment. °Left message on voicemail with name and callback number °Johnasia Liese RN Navigator Cardiac Imaging °[facility] °[phone] Office °[phone] Cell ° °

## 2021-02-27 ENCOUNTER — Ambulatory Visit (HOSPITAL_COMMUNITY): Admission: RE | Admit: 2021-02-27 | Payer: Medicare HMO | Source: Ambulatory Visit

## 2021-04-09 ENCOUNTER — Ambulatory Visit: Payer: Medicare HMO | Admitting: Cardiology

## 2021-09-02 DIAGNOSIS — I10 Essential (primary) hypertension: Secondary | ICD-10-CM | POA: Diagnosis not present

## 2021-09-02 DIAGNOSIS — E039 Hypothyroidism, unspecified: Secondary | ICD-10-CM | POA: Diagnosis not present

## 2021-11-20 DIAGNOSIS — M1711 Unilateral primary osteoarthritis, right knee: Secondary | ICD-10-CM | POA: Diagnosis not present

## 2021-11-20 DIAGNOSIS — G8929 Other chronic pain: Secondary | ICD-10-CM | POA: Diagnosis not present

## 2021-11-20 DIAGNOSIS — M25561 Pain in right knee: Secondary | ICD-10-CM | POA: Diagnosis not present

## 2021-12-01 DIAGNOSIS — S83231A Complex tear of medial meniscus, current injury, right knee, initial encounter: Secondary | ICD-10-CM | POA: Diagnosis not present

## 2021-12-10 DIAGNOSIS — M94261 Chondromalacia, right knee: Secondary | ICD-10-CM | POA: Diagnosis not present

## 2021-12-10 DIAGNOSIS — S83231A Complex tear of medial meniscus, current injury, right knee, initial encounter: Secondary | ICD-10-CM | POA: Diagnosis not present

## 2022-01-08 DIAGNOSIS — S83231A Complex tear of medial meniscus, current injury, right knee, initial encounter: Secondary | ICD-10-CM | POA: Diagnosis not present

## 2022-01-08 DIAGNOSIS — M2241 Chondromalacia patellae, right knee: Secondary | ICD-10-CM | POA: Diagnosis not present

## 2022-01-08 DIAGNOSIS — M65861 Other synovitis and tenosynovitis, right lower leg: Secondary | ICD-10-CM | POA: Diagnosis not present

## 2022-01-08 DIAGNOSIS — S83241A Other tear of medial meniscus, current injury, right knee, initial encounter: Secondary | ICD-10-CM | POA: Diagnosis not present

## 2022-01-08 DIAGNOSIS — M659 Synovitis and tenosynovitis, unspecified: Secondary | ICD-10-CM | POA: Diagnosis not present

## 2022-01-08 DIAGNOSIS — M94261 Chondromalacia, right knee: Secondary | ICD-10-CM | POA: Diagnosis not present

## 2022-01-16 DIAGNOSIS — M5431 Sciatica, right side: Secondary | ICD-10-CM | POA: Diagnosis not present

## 2022-01-16 DIAGNOSIS — Z9889 Other specified postprocedural states: Secondary | ICD-10-CM | POA: Diagnosis not present

## 2022-01-28 DIAGNOSIS — M9903 Segmental and somatic dysfunction of lumbar region: Secondary | ICD-10-CM | POA: Diagnosis not present

## 2022-01-28 DIAGNOSIS — M62830 Muscle spasm of back: Secondary | ICD-10-CM | POA: Diagnosis not present

## 2022-01-28 DIAGNOSIS — M9901 Segmental and somatic dysfunction of cervical region: Secondary | ICD-10-CM | POA: Diagnosis not present

## 2022-01-28 DIAGNOSIS — M9902 Segmental and somatic dysfunction of thoracic region: Secondary | ICD-10-CM | POA: Diagnosis not present

## 2022-01-28 DIAGNOSIS — M542 Cervicalgia: Secondary | ICD-10-CM | POA: Diagnosis not present

## 2022-01-28 DIAGNOSIS — M545 Low back pain, unspecified: Secondary | ICD-10-CM | POA: Diagnosis not present

## 2022-01-28 DIAGNOSIS — M62838 Other muscle spasm: Secondary | ICD-10-CM | POA: Diagnosis not present

## 2022-01-28 DIAGNOSIS — M546 Pain in thoracic spine: Secondary | ICD-10-CM | POA: Diagnosis not present

## 2022-02-03 DIAGNOSIS — R2689 Other abnormalities of gait and mobility: Secondary | ICD-10-CM | POA: Diagnosis not present

## 2022-02-03 DIAGNOSIS — Z9889 Other specified postprocedural states: Secondary | ICD-10-CM | POA: Diagnosis not present

## 2022-02-03 DIAGNOSIS — S83231D Complex tear of medial meniscus, current injury, right knee, subsequent encounter: Secondary | ICD-10-CM | POA: Diagnosis not present

## 2022-02-03 DIAGNOSIS — M5431 Sciatica, right side: Secondary | ICD-10-CM | POA: Diagnosis not present

## 2022-02-03 DIAGNOSIS — Z789 Other specified health status: Secondary | ICD-10-CM | POA: Diagnosis not present

## 2022-02-03 DIAGNOSIS — Z7409 Other reduced mobility: Secondary | ICD-10-CM | POA: Diagnosis not present

## 2022-02-09 DIAGNOSIS — M9901 Segmental and somatic dysfunction of cervical region: Secondary | ICD-10-CM | POA: Diagnosis not present

## 2022-02-09 DIAGNOSIS — M62838 Other muscle spasm: Secondary | ICD-10-CM | POA: Diagnosis not present

## 2022-02-09 DIAGNOSIS — M542 Cervicalgia: Secondary | ICD-10-CM | POA: Diagnosis not present

## 2022-02-09 DIAGNOSIS — M9902 Segmental and somatic dysfunction of thoracic region: Secondary | ICD-10-CM | POA: Diagnosis not present

## 2022-02-09 DIAGNOSIS — M546 Pain in thoracic spine: Secondary | ICD-10-CM | POA: Diagnosis not present

## 2022-02-09 DIAGNOSIS — M545 Low back pain, unspecified: Secondary | ICD-10-CM | POA: Diagnosis not present

## 2022-02-09 DIAGNOSIS — M62830 Muscle spasm of back: Secondary | ICD-10-CM | POA: Diagnosis not present

## 2022-02-09 DIAGNOSIS — M9903 Segmental and somatic dysfunction of lumbar region: Secondary | ICD-10-CM | POA: Diagnosis not present

## 2022-02-10 DIAGNOSIS — S83231D Complex tear of medial meniscus, current injury, right knee, subsequent encounter: Secondary | ICD-10-CM | POA: Diagnosis not present

## 2022-02-10 DIAGNOSIS — Z9889 Other specified postprocedural states: Secondary | ICD-10-CM | POA: Diagnosis not present

## 2022-03-10 DIAGNOSIS — R2689 Other abnormalities of gait and mobility: Secondary | ICD-10-CM | POA: Diagnosis not present

## 2022-03-10 DIAGNOSIS — Z7409 Other reduced mobility: Secondary | ICD-10-CM | POA: Diagnosis not present

## 2022-03-10 DIAGNOSIS — Z9889 Other specified postprocedural states: Secondary | ICD-10-CM | POA: Diagnosis not present

## 2022-03-10 DIAGNOSIS — S83231D Complex tear of medial meniscus, current injury, right knee, subsequent encounter: Secondary | ICD-10-CM | POA: Diagnosis not present

## 2022-03-10 DIAGNOSIS — Z789 Other specified health status: Secondary | ICD-10-CM | POA: Diagnosis not present

## 2022-06-09 DIAGNOSIS — Z1211 Encounter for screening for malignant neoplasm of colon: Secondary | ICD-10-CM | POA: Diagnosis not present

## 2022-06-09 DIAGNOSIS — E039 Hypothyroidism, unspecified: Secondary | ICD-10-CM | POA: Diagnosis not present

## 2022-06-09 DIAGNOSIS — Z0000 Encounter for general adult medical examination without abnormal findings: Secondary | ICD-10-CM | POA: Diagnosis not present

## 2022-06-09 DIAGNOSIS — Z125 Encounter for screening for malignant neoplasm of prostate: Secondary | ICD-10-CM | POA: Diagnosis not present

## 2022-06-09 DIAGNOSIS — E782 Mixed hyperlipidemia: Secondary | ICD-10-CM | POA: Diagnosis not present

## 2022-06-09 DIAGNOSIS — N529 Male erectile dysfunction, unspecified: Secondary | ICD-10-CM | POA: Diagnosis not present

## 2022-06-09 DIAGNOSIS — M109 Gout, unspecified: Secondary | ICD-10-CM | POA: Diagnosis not present

## 2022-06-09 DIAGNOSIS — I10 Essential (primary) hypertension: Secondary | ICD-10-CM | POA: Diagnosis not present

## 2022-06-15 DIAGNOSIS — E782 Mixed hyperlipidemia: Secondary | ICD-10-CM | POA: Diagnosis not present

## 2022-06-15 DIAGNOSIS — E039 Hypothyroidism, unspecified: Secondary | ICD-10-CM | POA: Diagnosis not present

## 2022-06-15 DIAGNOSIS — M109 Gout, unspecified: Secondary | ICD-10-CM | POA: Diagnosis not present

## 2022-06-15 DIAGNOSIS — I10 Essential (primary) hypertension: Secondary | ICD-10-CM | POA: Diagnosis not present

## 2022-06-15 DIAGNOSIS — Z125 Encounter for screening for malignant neoplasm of prostate: Secondary | ICD-10-CM | POA: Diagnosis not present

## 2022-06-15 DIAGNOSIS — N529 Male erectile dysfunction, unspecified: Secondary | ICD-10-CM | POA: Diagnosis not present

## 2022-06-15 DIAGNOSIS — Z0000 Encounter for general adult medical examination without abnormal findings: Secondary | ICD-10-CM | POA: Diagnosis not present

## 2022-10-15 DIAGNOSIS — M1711 Unilateral primary osteoarthritis, right knee: Secondary | ICD-10-CM | POA: Diagnosis not present

## 2022-10-15 DIAGNOSIS — M25561 Pain in right knee: Secondary | ICD-10-CM | POA: Diagnosis not present

## 2022-11-05 DIAGNOSIS — M1711 Unilateral primary osteoarthritis, right knee: Secondary | ICD-10-CM | POA: Diagnosis not present

## 2023-01-12 DIAGNOSIS — I2089 Other forms of angina pectoris: Secondary | ICD-10-CM | POA: Diagnosis not present

## 2023-01-12 DIAGNOSIS — E039 Hypothyroidism, unspecified: Secondary | ICD-10-CM | POA: Diagnosis not present

## 2023-01-12 DIAGNOSIS — I10 Essential (primary) hypertension: Secondary | ICD-10-CM | POA: Diagnosis not present

## 2023-01-12 DIAGNOSIS — I209 Angina pectoris, unspecified: Secondary | ICD-10-CM | POA: Diagnosis not present

## 2023-01-12 DIAGNOSIS — M109 Gout, unspecified: Secondary | ICD-10-CM | POA: Diagnosis not present

## 2023-07-13 DIAGNOSIS — M1711 Unilateral primary osteoarthritis, right knee: Secondary | ICD-10-CM | POA: Diagnosis not present

## 2023-07-23 DIAGNOSIS — M1711 Unilateral primary osteoarthritis, right knee: Secondary | ICD-10-CM | POA: Diagnosis not present

## 2023-07-23 DIAGNOSIS — M23611 Other spontaneous disruption of anterior cruciate ligament of right knee: Secondary | ICD-10-CM | POA: Diagnosis not present

## 2023-07-23 DIAGNOSIS — M23321 Other meniscus derangements, posterior horn of medial meniscus, right knee: Secondary | ICD-10-CM | POA: Diagnosis not present

## 2023-07-23 DIAGNOSIS — M23341 Other meniscus derangements, anterior horn of lateral meniscus, right knee: Secondary | ICD-10-CM | POA: Diagnosis not present

## 2023-07-23 DIAGNOSIS — M94261 Chondromalacia, right knee: Secondary | ICD-10-CM | POA: Diagnosis not present

## 2023-08-06 DIAGNOSIS — M1711 Unilateral primary osteoarthritis, right knee: Secondary | ICD-10-CM | POA: Diagnosis not present

## 2023-10-07 DIAGNOSIS — Z01818 Encounter for other preprocedural examination: Secondary | ICD-10-CM | POA: Diagnosis not present

## 2023-10-08 DIAGNOSIS — I10 Essential (primary) hypertension: Secondary | ICD-10-CM | POA: Diagnosis not present

## 2023-10-08 DIAGNOSIS — Z1330 Encounter for screening examination for mental health and behavioral disorders, unspecified: Secondary | ICD-10-CM | POA: Diagnosis not present

## 2023-10-08 DIAGNOSIS — R9431 Abnormal electrocardiogram [ECG] [EKG]: Secondary | ICD-10-CM | POA: Diagnosis not present

## 2023-10-11 DIAGNOSIS — M1711 Unilateral primary osteoarthritis, right knee: Secondary | ICD-10-CM | POA: Diagnosis not present

## 2023-10-11 DIAGNOSIS — G8918 Other acute postprocedural pain: Secondary | ICD-10-CM | POA: Diagnosis not present

## 2023-10-22 DIAGNOSIS — G8929 Other chronic pain: Secondary | ICD-10-CM | POA: Diagnosis not present

## 2023-10-22 DIAGNOSIS — Z96651 Presence of right artificial knee joint: Secondary | ICD-10-CM | POA: Diagnosis not present

## 2023-10-22 DIAGNOSIS — M25561 Pain in right knee: Secondary | ICD-10-CM | POA: Diagnosis not present

## 2023-10-22 DIAGNOSIS — M25661 Stiffness of right knee, not elsewhere classified: Secondary | ICD-10-CM | POA: Diagnosis not present

## 2023-10-22 DIAGNOSIS — Z789 Other specified health status: Secondary | ICD-10-CM | POA: Diagnosis not present

## 2023-10-22 DIAGNOSIS — Z7409 Other reduced mobility: Secondary | ICD-10-CM | POA: Diagnosis not present

## 2023-10-22 DIAGNOSIS — R29898 Other symptoms and signs involving the musculoskeletal system: Secondary | ICD-10-CM | POA: Diagnosis not present

## 2023-11-03 DIAGNOSIS — Z7409 Other reduced mobility: Secondary | ICD-10-CM | POA: Diagnosis not present

## 2023-11-03 DIAGNOSIS — R29898 Other symptoms and signs involving the musculoskeletal system: Secondary | ICD-10-CM | POA: Diagnosis not present

## 2023-11-03 DIAGNOSIS — G8929 Other chronic pain: Secondary | ICD-10-CM | POA: Diagnosis not present

## 2023-11-03 DIAGNOSIS — M25661 Stiffness of right knee, not elsewhere classified: Secondary | ICD-10-CM | POA: Diagnosis not present

## 2023-11-03 DIAGNOSIS — Z789 Other specified health status: Secondary | ICD-10-CM | POA: Diagnosis not present

## 2023-11-03 DIAGNOSIS — M25561 Pain in right knee: Secondary | ICD-10-CM | POA: Diagnosis not present

## 2023-11-03 DIAGNOSIS — Z96651 Presence of right artificial knee joint: Secondary | ICD-10-CM | POA: Diagnosis not present

## 2023-11-17 DIAGNOSIS — M25561 Pain in right knee: Secondary | ICD-10-CM | POA: Diagnosis not present

## 2023-11-17 DIAGNOSIS — R29898 Other symptoms and signs involving the musculoskeletal system: Secondary | ICD-10-CM | POA: Diagnosis not present

## 2023-11-17 DIAGNOSIS — M25661 Stiffness of right knee, not elsewhere classified: Secondary | ICD-10-CM | POA: Diagnosis not present

## 2023-11-17 DIAGNOSIS — G8929 Other chronic pain: Secondary | ICD-10-CM | POA: Diagnosis not present

## 2023-11-17 DIAGNOSIS — Z96651 Presence of right artificial knee joint: Secondary | ICD-10-CM | POA: Diagnosis not present

## 2023-12-02 DIAGNOSIS — M1711 Unilateral primary osteoarthritis, right knee: Secondary | ICD-10-CM | POA: Diagnosis not present

## 2023-12-14 DIAGNOSIS — M67811 Other specified disorders of synovium, right shoulder: Secondary | ICD-10-CM | POA: Diagnosis not present

## 2023-12-14 DIAGNOSIS — M25811 Other specified joint disorders, right shoulder: Secondary | ICD-10-CM | POA: Diagnosis not present

## 2023-12-14 DIAGNOSIS — M25511 Pain in right shoulder: Secondary | ICD-10-CM | POA: Diagnosis not present

## 2024-01-15 DIAGNOSIS — I10 Essential (primary) hypertension: Secondary | ICD-10-CM | POA: Diagnosis not present

## 2024-01-15 DIAGNOSIS — I2089 Other forms of angina pectoris: Secondary | ICD-10-CM | POA: Diagnosis not present

## 2024-01-15 DIAGNOSIS — E039 Hypothyroidism, unspecified: Secondary | ICD-10-CM | POA: Diagnosis not present

## 2024-02-14 DIAGNOSIS — I2089 Other forms of angina pectoris: Secondary | ICD-10-CM | POA: Diagnosis not present

## 2024-02-14 DIAGNOSIS — E039 Hypothyroidism, unspecified: Secondary | ICD-10-CM | POA: Diagnosis not present

## 2024-02-14 DIAGNOSIS — I10 Essential (primary) hypertension: Secondary | ICD-10-CM | POA: Diagnosis not present

## 2024-02-17 DIAGNOSIS — H52209 Unspecified astigmatism, unspecified eye: Secondary | ICD-10-CM | POA: Diagnosis not present

## 2024-02-17 DIAGNOSIS — H524 Presbyopia: Secondary | ICD-10-CM | POA: Diagnosis not present

## 2024-02-17 DIAGNOSIS — H5203 Hypermetropia, bilateral: Secondary | ICD-10-CM | POA: Diagnosis not present

## 2024-03-16 DIAGNOSIS — E039 Hypothyroidism, unspecified: Secondary | ICD-10-CM | POA: Diagnosis not present

## 2024-03-16 DIAGNOSIS — I2089 Other forms of angina pectoris: Secondary | ICD-10-CM | POA: Diagnosis not present

## 2024-03-16 DIAGNOSIS — I10 Essential (primary) hypertension: Secondary | ICD-10-CM | POA: Diagnosis not present

## 2024-04-16 DIAGNOSIS — I10 Essential (primary) hypertension: Secondary | ICD-10-CM | POA: Diagnosis not present

## 2024-04-16 DIAGNOSIS — I2089 Other forms of angina pectoris: Secondary | ICD-10-CM | POA: Diagnosis not present

## 2024-04-16 DIAGNOSIS — E039 Hypothyroidism, unspecified: Secondary | ICD-10-CM | POA: Diagnosis not present

## 2024-04-27 DIAGNOSIS — E039 Hypothyroidism, unspecified: Secondary | ICD-10-CM | POA: Diagnosis not present

## 2024-04-27 DIAGNOSIS — F3342 Major depressive disorder, recurrent, in full remission: Secondary | ICD-10-CM | POA: Diagnosis not present

## 2024-04-27 DIAGNOSIS — N529 Male erectile dysfunction, unspecified: Secondary | ICD-10-CM | POA: Diagnosis not present

## 2024-04-27 DIAGNOSIS — M109 Gout, unspecified: Secondary | ICD-10-CM | POA: Diagnosis not present

## 2024-04-27 DIAGNOSIS — Z0000 Encounter for general adult medical examination without abnormal findings: Secondary | ICD-10-CM | POA: Diagnosis not present

## 2024-04-27 DIAGNOSIS — I10 Essential (primary) hypertension: Secondary | ICD-10-CM | POA: Diagnosis not present

## 2024-04-27 DIAGNOSIS — E782 Mixed hyperlipidemia: Secondary | ICD-10-CM | POA: Diagnosis not present

## 2024-05-16 DIAGNOSIS — E039 Hypothyroidism, unspecified: Secondary | ICD-10-CM | POA: Diagnosis not present

## 2024-05-16 DIAGNOSIS — I10 Essential (primary) hypertension: Secondary | ICD-10-CM | POA: Diagnosis not present

## 2024-05-16 DIAGNOSIS — I2089 Other forms of angina pectoris: Secondary | ICD-10-CM | POA: Diagnosis not present

## 2024-05-16 DIAGNOSIS — E782 Mixed hyperlipidemia: Secondary | ICD-10-CM | POA: Diagnosis not present

## 2024-05-16 DIAGNOSIS — R1011 Right upper quadrant pain: Secondary | ICD-10-CM | POA: Diagnosis not present

## 2024-05-22 DIAGNOSIS — N3289 Other specified disorders of bladder: Secondary | ICD-10-CM | POA: Diagnosis not present

## 2024-05-22 DIAGNOSIS — R1011 Right upper quadrant pain: Secondary | ICD-10-CM | POA: Diagnosis not present

## 2024-05-22 DIAGNOSIS — K760 Fatty (change of) liver, not elsewhere classified: Secondary | ICD-10-CM | POA: Diagnosis not present

## 2024-05-22 DIAGNOSIS — N400 Enlarged prostate without lower urinary tract symptoms: Secondary | ICD-10-CM | POA: Diagnosis not present

## 2024-05-22 DIAGNOSIS — N1330 Unspecified hydronephrosis: Secondary | ICD-10-CM | POA: Diagnosis not present

## 2024-06-16 DIAGNOSIS — E039 Hypothyroidism, unspecified: Secondary | ICD-10-CM | POA: Diagnosis not present

## 2024-06-16 DIAGNOSIS — I2089 Other forms of angina pectoris: Secondary | ICD-10-CM | POA: Diagnosis not present

## 2024-06-16 DIAGNOSIS — I10 Essential (primary) hypertension: Secondary | ICD-10-CM | POA: Diagnosis not present

## 2024-07-16 DIAGNOSIS — E039 Hypothyroidism, unspecified: Secondary | ICD-10-CM | POA: Diagnosis not present

## 2024-07-16 DIAGNOSIS — I10 Essential (primary) hypertension: Secondary | ICD-10-CM | POA: Diagnosis not present

## 2024-07-16 DIAGNOSIS — I2089 Other forms of angina pectoris: Secondary | ICD-10-CM | POA: Diagnosis not present

## 2024-09-05 ENCOUNTER — Encounter: Payer: Self-pay | Admitting: Urology

## 2024-09-05 ENCOUNTER — Ambulatory Visit: Admitting: Urology

## 2024-09-05 VITALS — BP 197/102 | HR 60 | Ht 69.0 in | Wt 190.0 lb

## 2024-09-05 DIAGNOSIS — R10A1 Flank pain, right side: Secondary | ICD-10-CM

## 2024-09-05 DIAGNOSIS — N138 Other obstructive and reflux uropathy: Secondary | ICD-10-CM | POA: Diagnosis not present

## 2024-09-05 DIAGNOSIS — N1330 Unspecified hydronephrosis: Secondary | ICD-10-CM | POA: Diagnosis not present

## 2024-09-05 DIAGNOSIS — N200 Calculus of kidney: Secondary | ICD-10-CM | POA: Insufficient documentation

## 2024-09-05 DIAGNOSIS — N401 Enlarged prostate with lower urinary tract symptoms: Secondary | ICD-10-CM | POA: Diagnosis not present

## 2024-09-05 DIAGNOSIS — Z87442 Personal history of urinary calculi: Secondary | ICD-10-CM | POA: Diagnosis not present

## 2024-09-05 LAB — URINALYSIS, ROUTINE W REFLEX MICROSCOPIC
Bilirubin, UA: NEGATIVE
Glucose, UA: NEGATIVE
Ketones, UA: NEGATIVE
Leukocytes,UA: NEGATIVE
Nitrite, UA: NEGATIVE
Protein,UA: NEGATIVE
RBC, UA: NEGATIVE
Specific Gravity, UA: 1.02 (ref 1.005–1.030)
Urobilinogen, Ur: 0.2 mg/dL (ref 0.2–1.0)
pH, UA: 6 (ref 5.0–7.5)

## 2024-09-05 LAB — BASIC METABOLIC PANEL WITH GFR
BUN/Creatinine Ratio: 18 (ref 10–24)
BUN: 18 mg/dL (ref 8–27)
CO2: 22 mmol/L (ref 20–29)
Calcium: 10.9 mg/dL — ABNORMAL HIGH (ref 8.6–10.2)
Chloride: 99 mmol/L (ref 96–106)
Creatinine, Ser: 1.01 mg/dL (ref 0.76–1.27)
Glucose: 108 mg/dL — ABNORMAL HIGH (ref 70–99)
Potassium: 4.4 mmol/L (ref 3.5–5.2)
Sodium: 136 mmol/L (ref 134–144)
eGFR: 80 mL/min/1.73

## 2024-09-05 LAB — PSA: Prostate Specific Ag, Serum: 1.2 ng/mL (ref 0.0–4.0)

## 2024-09-05 NOTE — Progress Notes (Signed)
 "  Assessment: 1. Right flank pain   2. Nephrolithiasis   3. BPH with obstruction/lower urinary tract symptoms   4. Hydronephrosis, right     Plan: I personally reviewed the patient's chart including provider notes, lab and imaging results. I reviewed records from [facility]. BMP, PSA today. Recommend further evaluation with CT renal stone study for evaluation of right flank pain, right hydronephrosis seen on ultrasound, and history of nephrolithiasis. Will contact him with CT results and to arrange follow-up.  Chief Complaint:  Chief Complaint  Patient presents with   Nephrolithiasis    History of Present Illness:  Kristopher Sanford is a 74 y.o. male who is seen for evaluation of possible nephrolithiasis. He has a history of nephrolithiasis.  He was previously seen at [facility] in [facility] with his last visit in May 2022.  He was initially seen in April 2022 with a 3-week history of right sided flank pain.  CT imaging showed a 1 cm calculus at the right UVJ with right sided hydronephrosis and 2-3 mm calculus at the left UVJ without left hydronephrosis.  He underwent cystoscopy which revealed 2 stones in the bladder.  The stones were removed following laser cystolitholopaxy.  He also had placement of a right ureteral stent due to persistent evidence of obstruction secondary to ureteral edema.  He developed urinary retention postoperatively requiring Foley catheter placement.  His stent was subsequently removed and he was able to void without difficulty. Stone analysis: Calcium  oxalate monohydrate 58%, calcium  oxalate dihydrate 32%, calcium  phosphate 1%, uric acid 9%. He has not return for urologic follow-up since that time. He has had some intermittent right upper quadrant pain for several months.  It was initially thought that this might be cholelithiasis. Abdominal ultrasound from 05/22/2024 showed mild right hydronephrosis, thickened bladder wall with enlarged  prostate and postvoid residual of 430 mL. He has continued to have some right upper quadrant pain with associated right flank pain.  His symptoms improved but returned approximately 1 week ago.  He reports the right sided flank pain as mild in severity.  He has not required any pain medication.  No nausea or vomiting.  No fevers or chills.  He is not having any lower urinary tract symptoms other than urgency and intermittent stream.  No dysuria or gross hematuria.  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   Elevated blood-pressure reading, without diagnosis of hypertension 12/17/2020   Gout 12/17/2020   Hearing loss 12/17/2020   Incomplete emptying of bladder 11/29/2020   Mixed hyperlipidemia 12/17/2020   Urinary tract infection without hematuria 11/29/2020    Past Surgical History:  Past Surgical History:  Procedure Laterality Date   BILATERAL CARPAL TUNNEL RELEASE  1995   Kidney stone removed  10/2020   KNEE ARTHROSCOPY W/ MENISCAL REPAIR Right     Allergies:  Allergies[1]  Family History:  Family History  Problem Relation Age of Onset   Hypertension Mother    Hypertension Father    Hypertension Brother    Hypertension Brother     Social History:  Social History[2]  Review of symptoms:  Constitutional:  Negative for unexplained weight loss, night sweats, fever, chills ENT:  Negative for nose bleeds, sinus pain, painful swallowing CV:  Negative for chest pain, shortness of breath, exercise intolerance, palpitations, loss of consciousness Resp:  Negative for cough, wheezing,  shortness of breath GI:  Negative for nausea, vomiting, diarrhea, bloody stools GU:  Positives noted in HPI; otherwise negative for gross hematuria, dysuria, urinary incontinence Neuro:  Negative for seizures, poor balance, limb weakness, slurred speech Psych:  Negative for  lack of energy, depression, anxiety Endocrine:  Negative for polydipsia, polyuria, symptoms of hypoglycemia (dizziness, hunger, sweating) Hematologic:  Negative for anemia, purpura, petechia, prolonged or excessive bleeding, use of anticoagulants  Allergic:  Negative for difficulty breathing or choking as a result of exposure to anything; no shellfish allergy; no allergic response (rash/itch) to materials, foods  Physical exam: BP (!) 197/102   Pulse 60   Ht 5' 9 (1.753 m)   Wt 190 lb (86.2 kg)   BMI 28.06 kg/m  GENERAL APPEARANCE:  Well appearing, well developed, well nourished, NAD HEENT: Atraumatic, Normocephalic, oropharynx clear. NECK: Supple without lymphadenopathy or thyromegaly. LUNGS: Clear to auscultation bilaterally. HEART: Regular Rate and Rhythm without murmurs, gallops, or rubs. ABDOMEN: Soft, non-tender, No Masses. EXTREMITIES: Moves all extremities well.  Without clubbing, cyanosis, or edema. NEUROLOGIC:  Alert and oriented x 3, normal gait, CN II-XII grossly intact.  MENTAL STATUS:  Appropriate. BACK:  Non-tender to palpation.  No CVAT SKIN:  Warm, dry and intact.    Results: U/A: Negative      [1] No Known Allergies [2]  Social History Tobacco Use   Smoking status: Former    Types: Cigarettes    Start date: 08/17/1974   Smokeless tobacco: Never  Vaping Use   Vaping status: Never Used  Substance Use Topics   Alcohol use: Yes    Comment: Socially   Drug use: Never   "

## 2024-09-06 ENCOUNTER — Ambulatory Visit: Payer: Self-pay | Admitting: Urology

## 2024-09-08 ENCOUNTER — Ambulatory Visit (HOSPITAL_BASED_OUTPATIENT_CLINIC_OR_DEPARTMENT_OTHER)
Admission: RE | Admit: 2024-09-08 | Discharge: 2024-09-08 | Disposition: A | Source: Ambulatory Visit | Attending: Urology | Admitting: Urology

## 2024-09-08 DIAGNOSIS — R10A1 Flank pain, right side: Secondary | ICD-10-CM | POA: Diagnosis present

## 2024-09-08 DIAGNOSIS — N200 Calculus of kidney: Secondary | ICD-10-CM | POA: Insufficient documentation

## 2024-09-08 DIAGNOSIS — N1330 Unspecified hydronephrosis: Secondary | ICD-10-CM | POA: Insufficient documentation
